# Patient Record
Sex: Male | Born: 1985 | Race: White | Hispanic: No | Marital: Single | Smoking: Current every day smoker
Health system: Southern US, Community
[De-identification: ages and names within clinical notes are randomized; demographics above are authoritative.]

## PROBLEM LIST (undated history)

## (undated) HISTORY — PX: OTHER SURGICAL HISTORY: SHX169

---
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
## 2014-03-14 ENCOUNTER — Encounter (HOSPITAL_BASED_OUTPATIENT_CLINIC_OR_DEPARTMENT_OTHER): Payer: Self-pay | Admitting: Emergency Medicine

## 2014-03-14 ENCOUNTER — Emergency Department (HOSPITAL_BASED_OUTPATIENT_CLINIC_OR_DEPARTMENT_OTHER)
Admission: EM | Admit: 2014-03-14 | Discharge: 2014-03-14 | Disposition: A | Discharge status: Home / Self Care | Payer: Self-pay | Attending: Emergency Medicine | Admitting: Emergency Medicine

## 2014-03-14 DIAGNOSIS — L0201 Cutaneous abscess of face: Secondary | ICD-10-CM | POA: Insufficient documentation

## 2014-03-14 DIAGNOSIS — Z720 Tobacco use: Secondary | ICD-10-CM | POA: Insufficient documentation

## 2014-03-14 DIAGNOSIS — L03211 Cellulitis of face: Secondary | ICD-10-CM

## 2014-03-14 DIAGNOSIS — L709 Acne, unspecified: Secondary | ICD-10-CM | POA: Insufficient documentation

## 2014-03-14 MED ORDER — CLINDAMYCIN HCL 150 MG PO CAPS
450.0000 mg | ORAL_CAPSULE | Freq: Once | ORAL | Status: AC
  Administered 2014-03-14: 450 mg via ORAL
  Filled 2014-03-14: qty 3

## 2014-03-14 MED ORDER — CLINDAMYCIN HCL 150 MG PO CAPS: 450.0000 mg | ORAL_CAPSULE | Freq: Three times a day (TID) | ORAL | Status: DC

## 2014-03-14 NOTE — ED Notes (Signed)
Patient has a noted infection with redness and swelling to his right eye x 3 days. Patient reports that he is now getting another spot under his left eye. Both peri orbital areas are grossly swollen

## 2014-03-14 NOTE — Discharge Instructions (Signed)
Abscess °An abscess (boil or furuncle) is an infected area on or under the skin. This area is filled with yellowish-white fluid (pus) and other material (debris). °HOME CARE  °· Only take medicines as told by your doctor. °· If you were given antibiotic medicine, take it as directed. Finish the medicine even if you start to feel better. °· If gauze is used, follow your doctor's directions for changing the gauze. °· To avoid spreading the infection: °¨ Keep your abscess covered with a bandage. °¨ Wash your hands well. °¨ Do not share personal care items, towels, or whirlpools with others. °¨ Avoid skin contact with others. °· Keep your skin and clothes clean around the abscess. °· Keep all doctor visits as told. °GET HELP RIGHT AWAY IF:  °· You have more pain, puffiness (swelling), or redness in the wound site. °· You have more fluid or blood coming from the wound site. °· You have muscle aches, chills, or you feel sick. °· You have a fever. °MAKE SURE YOU:  °· Understand these instructions. °· Will watch your condition. °· Will get help right away if you are not doing well or get worse. °Document Released: 06/23/2007 Document Revised: 07/06/2011 Document Reviewed: 03/19/2011 °ExitCare® Patient Information ©2015 Exit[employer], LLC. This information is not intended to replace advice given to you by your health care provider. Make sure you discuss any questions you have with your health care provider. ° °

## 2014-03-14 NOTE — ED Provider Notes (Addendum)
CSN: [id number]     Arrival date & time 03/14/14  [id number] History   First MD Initiated Contact with Patient 03/14/14 0440     Chief Complaint  Patient presents with  . Cellulitis     (Consider location/radiation/quality/duration/timing/severity/associated sxs/prior Treatment) HPI  This is a 29 year old male with a history of multiple facial abscesses. He usually incises these himself. He developed a tender, cystic lesion lateral to his right eye about 3 days ago. 2 days ago he cut this open and expressed a large hard core of purulent material. He continues to have pain at the site and is requesting an antibiotic. He has been applying Neosporin without relief. He has been treated with clindamycin successfully in the past. He has multiple other small abscesses of his face and a few scattered lesions of his chest and extremities.  History reviewed. No pertinent past medical history. Past Surgical History  Procedure Laterality Date  . Vein replacent     No family history on file. History  Substance Use Topics  . Smoking status: Current Every Day Smoker  . Smokeless tobacco: Not on file  . Alcohol Use: No    Review of Systems  All other systems reviewed and are negative.   Allergies  Review of patient's allergies indicates no known allergies.  Home Medications   Prior to Admission medications   Not on File   BP 157/88 mmHg  Pulse 88  Temp(Src) 98.6 F (37 C) (Oral)  Resp 16  Ht 6' (1.829 m)  Wt 170 lb (77.111 kg)  BMI 23.05 kg/m2  SpO2 99%   Physical Exam  General: Well-developed, well-nourished male in no acute distress; appearance consistent with age of record HENT: normocephalic; healing wound lateral to right eye with surrounding tenderness and mild erythema Eyes: pupils equal, round and reactive to light; extraocular muscles intact Neck: supple Heart: regular rate and rhythm Lungs: clear to auscultation bilaterally Abdomen: soft; nondistended Extremities: No  deformity; full range of motion Neurologic: Awake, alert and oriented; motor function intact in all extremities and symmetric; no facial droop Skin: Warm and dry; facial acne with several small abscesses of the face, chest and extremities, none fluctuant or pointing Psychiatric: Normal mood and affect    ED Course  Procedures (including critical care time)   MDM    Hanley SeamenJohn L Seynabou Fults, MD 03/14/14 0449  Hanley SeamenJohn L Rorik Vespa, MD 03/14/14 [phone]

## 2014-04-22 ENCOUNTER — Encounter (HOSPITAL_BASED_OUTPATIENT_CLINIC_OR_DEPARTMENT_OTHER): Payer: Self-pay | Admitting: *Deleted

## 2014-04-22 ENCOUNTER — Emergency Department (HOSPITAL_BASED_OUTPATIENT_CLINIC_OR_DEPARTMENT_OTHER)
Admission: EM | Admit: 2014-04-22 | Discharge: 2014-04-22 | Disposition: A | Discharge status: Home / Self Care | Payer: Self-pay | Attending: Emergency Medicine | Admitting: Emergency Medicine

## 2014-04-22 ENCOUNTER — Emergency Department (HOSPITAL_BASED_OUTPATIENT_CLINIC_OR_DEPARTMENT_OTHER): Payer: Self-pay

## 2014-04-22 DIAGNOSIS — Z792 Long term (current) use of antibiotics: Secondary | ICD-10-CM | POA: Insufficient documentation

## 2014-04-22 DIAGNOSIS — Z791 Long term (current) use of non-steroidal anti-inflammatories (NSAID): Secondary | ICD-10-CM | POA: Insufficient documentation

## 2014-04-22 DIAGNOSIS — N5089 Other specified disorders of the male genital organs: Secondary | ICD-10-CM

## 2014-04-22 DIAGNOSIS — N451 Epididymitis: Secondary | ICD-10-CM | POA: Insufficient documentation

## 2014-04-22 DIAGNOSIS — Z720 Tobacco use: Secondary | ICD-10-CM | POA: Insufficient documentation

## 2014-04-22 LAB — URINALYSIS, ROUTINE W REFLEX MICROSCOPIC
BILIRUBIN URINE: NEGATIVE
GLUCOSE, UA: NEGATIVE mg/dL
Hgb urine dipstick: NEGATIVE
KETONES UR: NEGATIVE mg/dL
Nitrite: NEGATIVE
PH: 6 (ref 5.0–8.0)
PROTEIN: NEGATIVE mg/dL
Specific Gravity, Urine: 1.027 (ref 1.005–1.030)
Urobilinogen, UA: 1 mg/dL (ref 0.0–1.0)

## 2014-04-22 LAB — URINE MICROSCOPIC-ADD ON

## 2014-04-22 MED ORDER — AZITHROMYCIN 250 MG PO TABS
1000.0000 mg | ORAL_TABLET | Freq: Once | ORAL | Status: AC
  Administered 2014-04-22: 1000 mg via ORAL
  Filled 2014-04-22: qty 4

## 2014-04-22 MED ORDER — LIDOCAINE HCL (PF) 1 % IJ SOLN
INTRAMUSCULAR | Status: AC
  Administered 2014-04-22: 0.9 mL
  Filled 2014-04-22: qty 5

## 2014-04-22 MED ORDER — NAPROXEN 500 MG PO TABS: 500.0000 mg | ORAL_TABLET | Freq: Two times a day (BID) | ORAL | Status: DC

## 2014-04-22 MED ORDER — DOXYCYCLINE HYCLATE 100 MG PO CAPS: 100.0000 mg | ORAL_CAPSULE | Freq: Two times a day (BID) | ORAL | Status: DC

## 2014-04-22 MED ORDER — OXYCODONE-ACETAMINOPHEN 5-325 MG PO TABS
2.0000 | ORAL_TABLET | Freq: Once | ORAL | Status: AC
Start: 2014-04-22 — End: 2014-04-22
  Administered 2014-04-22: 2 via ORAL
  Filled 2014-04-22: qty 2

## 2014-04-22 MED ORDER — CEFTRIAXONE SODIUM 250 MG IJ SOLR
250.0000 mg | Freq: Once | INTRAMUSCULAR | Status: AC
  Administered 2014-04-22: 250 mg via INTRAMUSCULAR
  Filled 2014-04-22: qty 250

## 2014-04-22 MED ORDER — HYDROCODONE-ACETAMINOPHEN 5-325 MG PO TABS: 2.0000 | ORAL_TABLET | ORAL | Status: DC | PRN

## 2014-04-22 NOTE — Discharge Instructions (Signed)
Epididymitis Epididymitis is a swelling (inflammation) of the epididymis. The epididymis is a cord-like structure along the back part of the testicle. Epididymitis is usually, but not always, caused by infection. This is usually a sudden problem beginning with chills, fever and pain behind the scrotum and in the testicle. There may be swelling and redness of the testicle. DIAGNOSIS  Physical examination will reveal a tender, swollen epididymis. Sometimes, cultures are obtained from the urine or from prostate secretions to help find out if there is an infection or if the cause is a different problem. Sometimes, blood work is performed to see if your white blood cell count is elevated and if a germ (bacterial) or viral infection is present. Using this knowledge, an appropriate medicine which kills germs (antibiotic) can be chosen by your caregiver. A viral infection causing epididymitis will most often go away (resolve) without treatment. HOME CARE INSTRUCTIONS   Hot sitz baths for 20 minutes, 4 times per day, may help relieve pain.  Only take over-the-counter or prescription medicines for pain, discomfort or fever as directed by your caregiver.  Take all medicines, including antibiotics, as directed. Take the antibiotics for the full prescribed length of time even if you are feeling better.  It is very important to keep all follow-up appointments. SEEK IMMEDIATE MEDICAL CARE IF:   You have a fever.  You have pain not relieved with medicines.  You have any worsening of your problems.  Your pain seems to come and go.  You develop pain, redness, and swelling in the scrotum and surrounding areas. MAKE SURE YOU:   Understand these instructions.  Will watch your condition.  Will get help right away if you are not doing well or get worse. Document Released: 01/02/2000 Document Revised: 03/29/2011 Document Reviewed: 11/21/2008 [facility] Patient Information 2015 [place], [place]. This information  is not intended to replace advice given to you by your health care provider. Make sure you discuss any questions you have with your health care provider.  Pelvic Infection  If you have been diagnosed with a pelvic infection such as a sexually transmitted disease, you will need to be treated with antibiotics. Please take the medicines as prescribed. Some of these tests do not come back for 1-2 days in which case if they turn positive you will receive a phone call to let you know. If you are contacted and do have an infection consistent with a sexually transmitted disease, then you will need to tell any and all sexual partners that you have had in the last 6 months no so that they can be tested and treated as well. If you should develop severe or worsening pain in your abdomen or the pelvis or develop severe fevers,nausea or vomiting that prevent you from taking your medications, return to the emergency department immediately. Otherwise contact your local physician or county health department for a follow up appointment to complete STD testing including HIV and syphilis.  See the list of phone numbers below.  RESOURCE GUIDE  If unable to pay or uninsured, contact:  [facility] or [facility] to become qualified for the adult dental clinic.  Chronic Pain Problems Contact [facility]  [phone] Patients need to be referred by their primary care doctor.  Insufficient Money for Medicine Contact [employer]:  call "211" or [facility] [phone].  No Primary Care Doctor Call [facility]  [phone] Other agencies that provide inexpensive medical care    [facility]  [phone]    [facility]  [phone]    [facility]  [phone]    [facility]  [phone]    [facility]  [phone]    [facility]  [phone]

## 2014-04-22 NOTE — ED Notes (Signed)
Patient called for triage, patient outside walking around.  Patient returns to lobby needing wheelchair, wheelchair provided.

## 2014-04-22 NOTE — ED Notes (Addendum)
Directed to pharmacy to pick up meds- pt has called for his ride- no s/s of allergic reaction after rocephin given IM

## 2014-04-22 NOTE — ED Notes (Signed)
Patient transported to Ultrasound and returned 

## 2014-04-22 NOTE — ED Provider Notes (Signed)
CSN: [id number]     Arrival date & time 04/22/14  0909 History   First MD Initiated Contact with Patient 04/22/14 0920     Chief Complaint  Patient presents with  . Testicle Pain     (Consider location/radiation/quality/duration/timing/severity/associated sxs/prior Treatment) HPI Comments: 29 year old male, complains of gradual onset of swelling of his left testicle which has been persistent, gradually worsening, worse with palpation and ambulation. He denies associated fevers chills nausea or vomiting. He states this started approximately the same time that he had to push his Zena[employer] Niecevan when it ran out of gas. He denies any back pain. No medications given prior to arrival.  Patient is a 29 y.o. male presenting with testicular pain. The history is provided by the patient.  Testicle Pain    History reviewed. No pertinent past medical history. Past Surgical History  Procedure Laterality Date  . Vein replacent     No family history on file. History  Substance Use Topics  . Smoking status: Current Every Day Smoker  . Smokeless tobacco: Not on file  . Alcohol Use: No    Review of Systems  Constitutional: Negative for fever.  Gastrointestinal: Negative for nausea and vomiting.  Genitourinary: Positive for testicular pain.      Allergies  Review of patient's allergies indicates no known allergies.  Home Medications   Prior to Admission medications   Medication Sig Start Date End Date Taking? Authorizing Provider  clindamycin (CLEOCIN) 150 MG capsule Take 3 capsules (450 mg total) by mouth 3 (three) times daily. 03/14/14   John Molpus, MD  doxycycline (VIBRAMYCIN) 100 MG capsule Take 1 capsule (100 mg total) by mouth 2 (two) times daily. 04/22/14   Eber HongBrian Emmry Hinsch, MD  HYDROcodone-acetaminophen (NORCO/VICODIN) 5-325 MG per tablet Take 2 tablets by mouth every 4 (four) hours as needed. 04/22/14   Eber HongBrian Camira Geidel, MD  naproxen (NAPROSYN) 500 MG tablet Take 1 tablet (500 mg total) by mouth 2 (two)  times daily with a meal. 04/22/14   Eber HongBrian Hisham Provence, MD   BP 128/72 mmHg  Pulse 92  Temp(Src) 98.2 F (36.8 C) (Oral)  Resp 16  Ht 6' (1.829 m)  Wt 170 lb (77.111 kg)  BMI 23.05 kg/m2  SpO2 97% Physical Exam  Constitutional: He appears well-developed and well-nourished.  HENT:  Head: Normocephalic and atraumatic.  Eyes: Conjunctivae are normal. Right eye exhibits no discharge. Left eye exhibits no discharge.  Pulmonary/Chest: Effort normal. No respiratory distress.  Abdominal:  No masses or tenderness, no inguinal lymphadenopathy or masses  Genitourinary:  Normal appearing circumcised penis, left hemiscrotum is red, swollen, left testicle is enlarged and tender, right testicle normal, right hemiscrotum normal, no urethral discharge  Neurological: He is alert. Coordination normal.  Skin: Skin is warm and dry. No rash noted. He is not diaphoretic. No erythema.  Psychiatric: He has a normal mood and affect.  Nursing note and vitals reviewed.   ED Course  Procedures (including critical care time) Labs Review Labs Reviewed  URINALYSIS, ROUTINE W REFLEX MICROSCOPIC - Abnormal; Notable for the following:    Leukocytes, UA SMALL (*)    All other components within normal limits  URINE MICROSCOPIC-ADD ON - Abnormal; Notable for the following:    Squamous Epithelial / LPF FEW (*)    Bacteria, UA MANY (*)    All other components within normal limits    Imaging Review [place] Scrotum  04/22/2014   CLINICAL DATA:  Three days of left testicular de scrotal pain.  EXAM: SCROTAL ULTRASOUND  DOPPLER ULTRASOUND OF THE TESTICLES  TECHNIQUE: Complete ultrasound examination of the testicles, epididymis, and other scrotal structures was performed. Color and spectral Doppler ultrasound were also utilized to evaluate blood flow to the testicles.  COMPARISON:  None.  FINDINGS: Right testicle  Measurements: 4.3 x 2.1 x 2.5 cm. No mass or microlithiasis visualized.  Left testicle  Measurements: 3.7 x 2.1 x 2.8 cm.  No mass or microlithiasis visualized.  Right epididymis:  Normal in size and appearance.  Left epididymis: The epididymis on the left is enlarged and exhibits heterogeneously decreased echotexture.  Hydrocele:  There is a hydrocele on left containing septations.  Varicocele:  None visualized.  Pulsed Doppler interrogation of both testes demonstrates normal low resistance arterial and venous waveforms bilaterally.  IMPRESSION: 1. The testes and testicular vascularity is normal. The right epididymis is normal. 2. Increased size and heterogeneous echotexture of the left epididymis most compatible with acute epididymitis. There is also a complex appearing hydrocele on the left likely reflecting inflammatory change.   Electronically Signed   By: David  [place]   On: 04/22/2014 10:40   [place] Art/ven Flow Abd Pelv Doppler  04/22/2014   CLINICAL DATA:  Three days of left testicular de scrotal pain.  EXAM: SCROTAL ULTRASOUND  DOPPLER ULTRASOUND OF THE TESTICLES  TECHNIQUE: Complete ultrasound examination of the testicles, epididymis, and other scrotal structures was performed. Color and spectral Doppler ultrasound were also utilized to evaluate blood flow to the testicles.  COMPARISON:  None.  FINDINGS: Right testicle  Measurements: 4.3 x 2.1 x 2.5 cm. No mass or microlithiasis visualized.  Left testicle  Measurements: 3.7 x 2.1 x 2.8 cm. No mass or microlithiasis visualized.  Right epididymis:  Normal in size and appearance.  Left epididymis: The epididymis on the left is enlarged and exhibits heterogeneously decreased echotexture.  Hydrocele:  There is a hydrocele on left containing septations.  Varicocele:  None visualized.  Pulsed Doppler interrogation of both testes demonstrates normal low resistance arterial and venous waveforms bilaterally.  IMPRESSION: 1. The testes and testicular vascularity is normal. The right epididymis is normal. 2. Increased size and heterogeneous echotexture of the left epididymis most  compatible with acute epididymitis. There is also a complex appearing hydrocele on the left likely reflecting inflammatory change.   Electronically Signed   By: David  [place]   On: 04/22/2014 10:40     MDM   Final diagnoses:  Testicle swelling  Epididymitis   No visible cremasteric reflex. Concern for testicular torsion or epididymitis the latter being more likely as the patient has a history of gonorrhea per his report.  Vs normal.  [place] c/w epidydimitis - VS normal - pt given meds as below - stable for d/c.  Meds given in ED:  Medications  cefTRIAXone (ROCEPHIN) injection 250 mg (not administered)  azithromycin (ZITHROMAX) tablet 1,000 mg (not administered)  oxyCODONE-acetaminophen (PERCOCET/ROXICET) 5-325 MG per tablet 2 tablet (2 tablets Oral Given 04/22/14 0948)    New Prescriptions   DOXYCYCLINE (VIBRAMYCIN) 100 MG CAPSULE    Take 1 capsule (100 mg total) by mouth 2 (two) times daily.   HYDROCODONE-ACETAMINOPHEN (NORCO/VICODIN) 5-325 MG PER TABLET    Take 2 tablets by mouth every 4 (four) hours as needed.   NAPROXEN (NAPROSYN) 500 MG TABLET    Take 1 tablet (500 mg total) by mouth 2 (two) times daily with a meal.        Eber Hong, MD 04/22/14 1052

## 2014-04-22 NOTE — ED Notes (Signed)
Pt c/o swollen left testicle x2 days. Pt denies dysuria.

## 2018-08-14 ENCOUNTER — Emergency Department (HOSPITAL_COMMUNITY)
Admission: EM | Admit: 2018-08-14 | Discharge: 2018-08-14 | Disposition: A | Discharge status: Home / Self Care | Payer: Self-pay | Attending: Emergency Medicine | Admitting: Emergency Medicine

## 2018-08-14 ENCOUNTER — Encounter (HOSPITAL_COMMUNITY): Payer: Self-pay

## 2018-08-14 ENCOUNTER — Emergency Department (HOSPITAL_COMMUNITY): Payer: Self-pay

## 2018-08-14 ENCOUNTER — Other Ambulatory Visit: Payer: Self-pay

## 2018-08-14 DIAGNOSIS — F1210 Cannabis abuse, uncomplicated: Secondary | ICD-10-CM | POA: Insufficient documentation

## 2018-08-14 DIAGNOSIS — F1410 Cocaine abuse, uncomplicated: Secondary | ICD-10-CM | POA: Insufficient documentation

## 2018-08-14 DIAGNOSIS — N50811 Right testicular pain: Secondary | ICD-10-CM

## 2018-08-14 DIAGNOSIS — R1030 Lower abdominal pain, unspecified: Secondary | ICD-10-CM

## 2018-08-14 DIAGNOSIS — M545 Low back pain, unspecified: Secondary | ICD-10-CM

## 2018-08-14 DIAGNOSIS — N453 Epididymo-orchitis: Secondary | ICD-10-CM

## 2018-08-14 DIAGNOSIS — R319 Hematuria, unspecified: Secondary | ICD-10-CM | POA: Insufficient documentation

## 2018-08-14 DIAGNOSIS — F1510 Other stimulant abuse, uncomplicated: Secondary | ICD-10-CM | POA: Insufficient documentation

## 2018-08-14 DIAGNOSIS — R300 Dysuria: Secondary | ICD-10-CM

## 2018-08-14 DIAGNOSIS — F17210 Nicotine dependence, cigarettes, uncomplicated: Secondary | ICD-10-CM | POA: Insufficient documentation

## 2018-08-14 LAB — URINALYSIS, ROUTINE W REFLEX MICROSCOPIC
Bilirubin Urine: NEGATIVE
Glucose, UA: NEGATIVE mg/dL
Hgb urine dipstick: NEGATIVE
Ketones, ur: NEGATIVE mg/dL
Leukocytes,Ua: NEGATIVE
Nitrite: NEGATIVE
Protein, ur: NEGATIVE mg/dL
Specific Gravity, Urine: 1.01 (ref 1.005–1.030)
pH: 8 (ref 5.0–8.0)

## 2018-08-14 LAB — CBC WITH DIFFERENTIAL/PLATELET
Abs Immature Granulocytes: 0.06 10*3/uL (ref 0.00–0.07)
Basophils Absolute: 0 10*3/uL (ref 0.0–0.1)
Basophils Relative: 0 %
Eosinophils Absolute: 0.1 10*3/uL (ref 0.0–0.5)
Eosinophils Relative: 1 %
HCT: 44.1 % (ref 39.0–52.0)
Hemoglobin: 14.6 g/dL (ref 13.0–17.0)
Immature Granulocytes: 1 %
Lymphocytes Relative: 10 %
Lymphs Abs: 1.2 10*3/uL (ref 0.7–4.0)
MCH: 30.1 pg (ref 26.0–34.0)
MCHC: 33.1 g/dL (ref 30.0–36.0)
MCV: 90.9 fL (ref 80.0–100.0)
Monocytes Absolute: 0.9 10*3/uL (ref 0.1–1.0)
Monocytes Relative: 8 %
Neutro Abs: 10.3 10*3/uL — ABNORMAL HIGH (ref 1.7–7.7)
Neutrophils Relative %: 80 %
Platelets: 242 10*3/uL (ref 150–400)
RBC: 4.85 MIL/uL (ref 4.22–5.81)
RDW: 13.1 % (ref 11.5–15.5)
WBC: 12.6 10*3/uL — ABNORMAL HIGH (ref 4.0–10.5)
nRBC: 0 % (ref 0.0–0.2)

## 2018-08-14 LAB — COMPREHENSIVE METABOLIC PANEL
ALT: 23 U/L (ref 0–44)
AST: 21 U/L (ref 15–41)
Albumin: 3.7 g/dL (ref 3.5–5.0)
Alkaline Phosphatase: 61 U/L (ref 38–126)
Anion gap: 11 (ref 5–15)
BUN: 12 mg/dL (ref 6–20)
CO2: 27 mmol/L (ref 22–32)
Calcium: 9.2 mg/dL (ref 8.9–10.3)
Chloride: 97 mmol/L — ABNORMAL LOW (ref 98–111)
Creatinine, Ser: 0.88 mg/dL (ref 0.61–1.24)
GFR calc Af Amer: >60 mL/min (ref >60)
GFR calc non Af Amer: >60 mL/min (ref >60)
Glucose, Bld: 117 mg/dL — ABNORMAL HIGH (ref 70–99)
Potassium: 4.1 mmol/L (ref 3.5–5.1)
Sodium: 135 mmol/L (ref 135–145)
Total Bilirubin: 0.6 mg/dL (ref 0.3–1.2)
Total Protein: 7.9 g/dL (ref 6.5–8.1)

## 2018-08-14 LAB — RAPID URINE DRUG SCREEN, HOSP PERFORMED
Amphetamines: POSITIVE — AB
Barbiturates: NOT DETECTED
Benzodiazepines: NOT DETECTED
Cocaine: NOT DETECTED
Opiates: NOT DETECTED
Tetrahydrocannabinol: NOT DETECTED

## 2018-08-14 LAB — LIPASE, BLOOD: Lipase: 23 U/L (ref 11–51)

## 2018-08-14 MED ORDER — CEFTRIAXONE SODIUM 250 MG IJ SOLR
250.0000 mg | Freq: Once | INTRAMUSCULAR | Status: AC
  Administered 2018-08-14: 12:00:00 250 mg via INTRAMUSCULAR
  Filled 2018-08-14: qty 250

## 2018-08-14 MED ORDER — IOHEXOL 300 MG/ML  SOLN
100.0000 mL | Freq: Once | INTRAMUSCULAR | Status: AC | PRN
  Administered 2018-08-14: 12:00:00 100 mL via INTRAVENOUS

## 2018-08-14 MED ORDER — SODIUM CHLORIDE (PF) 0.9 % IJ SOLN
INTRAMUSCULAR | Status: AC
  Filled 2018-08-14: qty 50

## 2018-08-14 MED ORDER — LIDOCAINE HCL 1 % IJ SOLN
INTRAMUSCULAR | Status: AC
  Administered 2018-08-14: 12:00:00
  Filled 2018-08-14: qty 20

## 2018-08-14 MED ORDER — ONDANSETRON 4 MG PO TBDP
4.0000 mg | ORAL_TABLET | Freq: Once | ORAL | Status: AC
  Administered 2018-08-14: 12:00:00 4 mg via ORAL
  Filled 2018-08-14: qty 1

## 2018-08-14 MED ORDER — AZITHROMYCIN 250 MG PO TABS
1000.0000 mg | ORAL_TABLET | Freq: Once | ORAL | Status: AC
  Administered 2018-08-14: 12:00:00 1000 mg via ORAL
  Filled 2018-08-14: qty 4

## 2018-08-14 MED ORDER — KETOROLAC TROMETHAMINE 30 MG/ML IJ SOLN
30.0000 mg | Freq: Once | INTRAMUSCULAR | Status: AC
  Administered 2018-08-14: 12:00:00 30 mg via INTRAVENOUS
  Filled 2018-08-14: qty 1

## 2018-08-14 MED ORDER — NAPROXEN 500 MG PO TABS: 500.0000 mg | ORAL_TABLET | Freq: Two times a day (BID) | ORAL | 0 refills | Status: AC

## 2018-08-14 NOTE — ED Notes (Signed)
Discharge instructions reviewed with patient. Patient verbalizes understanding. VSS. Patient discharged to [employer] custody. PIV removed. Discharge instructions and prescription given to [facility] officer.

## 2018-08-14 NOTE — ED Notes (Signed)
GC chlamydia swab completed by EDPA. This RN acted as witness. Patient tolerated well.

## 2018-08-14 NOTE — Discharge Instructions (Addendum)
You have been seen today for testicular pain/edema, lower abdominal pain, and back pain. Please read and follow all provided instructions.   1. Medications: naproxen for pain, usual home medications 2. Treatment: rest, drink plenty of fluids 3. Follow Up: Please follow up with your primary doctor in 2 days for discussion of your diagnoses and further evaluation after today's visit; if you do not have a primary care doctor use the resource guide provided to find one; Please return to the ER for any new or worsening symptoms. Please obtain all of your results from medical records or have your doctors office obtain the results - share them with your doctor - you should be seen at your doctors office. Call today to arrange your follow up.   Take medications as prescribed. Please review all of the medicines and only take them if you do not have an allergy to them. Return to the emergency room for worsening condition or new concerning symptoms. Follow up with your regular doctor. If you don't have a regular doctor use one of the numbers below to establish a primary care doctor.  Please be aware that if you are taking birth control pills, taking other prescriptions, ESPECIALLY ANTIBIOTICS may make the birth control ineffective - if this is the case, either do not engage in sexual activity or use alternative methods of birth control such as condoms until you have finished the medicine and your family doctor says it is OK to restart them. If you are on a blood thinner such as COUMADIN, be aware that any other medicine that you take may cause the coumadin to either work too much, or not enough - you should have your coumadin level rechecked in next 7 days if this is the case.  ?  It is also a possibility that you have an allergic reaction to any of the medicines that you have been prescribed - Everybody reacts differently to medications and while MOST people have no trouble with most medicines, you may have a  reaction such as nausea, vomiting, rash, swelling, shortness of breath. If this is the case, please stop taking the medicine immediately and contact your physician.  ?  You should return to the ER if you develop severe or worsening symptoms.   Emergency Department Resource Guide 1) Find a Doctor and Pay Out of Pocket Although you won't have to find out who is covered by your insurance plan, it is a good idea to ask around and get recommendations. You will then need to call the office and see if the doctor you have chosen will accept you as a new patient and what types of options they offer for patients who are self-pay. Some doctors offer discounts or will set up payment plans for their patients who do not have insurance, but you will need to ask so you aren't surprised when you get to your appointment.  2) Contact Your Local Health Department Not all health departments have doctors that can see patients for sick visits, but many do, so it is worth a call to see if yours does. If you don't know where your local health department is, you can check in your phone book. The CDC also has a tool to help you locate your state's health department, and many state websites also have listings of all of their local health departments.  3) Find a [facility] If your illness is not likely to be very severe or complicated, you may want to try a  walk in clinic. These are popping up all over the country in pharmacies, drugstores, and shopping centers. They're usually staffed by nurse practitioners or physician assistants that have been trained to treat common illnesses and complaints. They're usually fairly quick and inexpensive. However, if you have serious medical issues or chronic medical problems, these are probably not your best option.  No Primary Care Doctor: Call [facility] at  [phone] - they can help you locate a primary care doctor that  accepts your insurance, provides certain services,  etc. Physician Referral Service- [phone]  Chronic Pain Problems: Organization         Address  Phone   Notes  [facility]  [phone] Patients need to be referred by their primary care doctor.   Medication Assistance: Organization         Address  Phone   Notes  [facility] Medication [facility] [address] [place], [place] [address] [phone] --Must be a resident of [facility] -- Must have NO insurance coverage whatsoever (no Medicaid/ Medicare, etc.) -- The pt. MUST have a primary care doctor that directs their care regularly and follows them in the community   [facility]  [phone]   [employer]  [phone]    Agencies that provide inexpensive medical care: Organization         Address  Phone   Notes  [place]  [phone]   [facility]    [phone]   [facility] [place], [place] [address] [phone]   [place] [address] [place] [phone]   [facility]    [phone]   [facility]    [phone]   [place] and [place] [address], [place] Phone:  [phone], Fax:  [phone] Hours of Operation:  9 am - 6 pm, M-F.  Also accepts Medicaid/Medicare and self-pay.  [facility] for [place] [address], [address], [place] Phone: [phone], Fax: [phone]. Hours of Operation:  8:30 am - 5:30 pm, M-F.  Also accepts Medicaid and self-pay.  [facility] [address], [place] Phone: [phone]   [place], [place], [place] [phone], Ext. [phone] Mondays & Thursdays: 7-9 AM.  First 15 patients are seen on a first come, first serve basis.    [place] Providers:  Organization         Address  Phone   Notes  [facility] [address], [place] [phone] Also accepts self-pay patients.  [facility] [address], [place]  [phone]   [place], [address], [place] [phone]   [facility] [address] [place] [phone]   Lucianne Lei [address], [place]   [phone] Only accepts [place] [facility] [place] patients after they have their name applied to their card.   Self-Pay (no insurance) in [facility]:  Organization         Address  Phone   Notes  Sickle Cell Patients, [facility] [place] [phone]   [facility] [place] [phone]   [facility] [place]  [address], [address], [place] [phone]   [facility]/Dr. Osei-Bonsu  [address] [place] or [address], [place] [phone] Phone number for both [place] and [place] locations is the same.  [facility] and [facility] [address] [place] [phone]   [facility] [address], [place] or [address] [phone]   [facility] [address] [place] [phone], phone; [phone], fax Sees patients 1st and 3rd Saturday of every month.  Must not qualify for public or private insurance (i.e. Medicaid, Medicare, [place] [employer] Choice, Veterans' Benefits)  Household income should be no more than 200% of the poverty level The clinic cannot treat you if you are pregnant or think you are pregnant  Sexually transmitted diseases are not treated at the clinic.

## 2018-08-14 NOTE — ED Provider Notes (Signed)
[facility]-EMERGENCY DEPT Provider Note   CSN: [id number] Arrival date & time: 08/14/18  0845    History   Chief Complaint Chief Complaint  Patient presents with   Back Pain   Abdominal Pain   Groin Swelling   Dysuria    HPI Samuel Potter is a 33 y.o. male with a PMH of Epididymitis presents with constant right sided testicular edema, pain, and erythema onset 2 days ago. Patient also has associated dysuria, penile discharge, abdominal pain, bilateral low back pain, and hematuria. Patient denies fever, vomiting, diarrhea or constipation. Patient denies any difficulty with bowel movements. Patient states he is sexually active with one person and does not use protection. Patient denies taking any medications prior to arrival. Patient states symptoms are worse with palpation and nothing makes symptoms better. Patient reports he had similar symptoms 3 months ago and required antibiotics. Patient denies sick contacts or recent travel. Patient denies any abdominal surgeries. Denies numbness, tingling, weakness, incontinence to bowel/bladder, fever, chills, recent IV drug use, or hx of cancer. Patient was brought by [employer] due to multiple felony charges.      HPI  History reviewed. No pertinent past medical history.  There are no active problems to display for this patient.   Past Surgical History:  Procedure Laterality Date   vein replacement     vein replacent          Home Medications    Prior to Admission medications   Medication Sig Start Date End Date Taking? Authorizing Provider  naproxen (NAPROSYN) 500 MG tablet Take 1 tablet (500 mg total) by mouth 2 (two) times daily. 08/14/18   Leretha DykesHernandez, Asberry Lascola P, PA-C    Family History History reviewed. No pertinent family history.  Social History Social History   Tobacco Use   Smoking status: Current Every Day Smoker    Packs/day: 0.50    Types: Cigarettes   Smokeless tobacco: Never Used   Substance Use Topics   Alcohol use: No   Drug use: Yes    Types: Methamphetamines, Marijuana, Cocaine    Comment: heroin     Allergies   Patient has no known allergies.   Review of Systems Review of Systems  Constitutional: Negative for activity change, appetite change, chills, fever and unexpected weight change.  HENT: Negative for congestion, rhinorrhea and sore throat.   Eyes: Negative for visual disturbance.  Respiratory: Negative for cough and shortness of breath.   Cardiovascular: Negative for chest pain.  Gastrointestinal: Positive for abdominal pain. Negative for abdominal distention, anal bleeding, blood in stool, constipation, diarrhea, nausea, rectal pain and vomiting.  Endocrine: Negative for polydipsia, polyphagia and polyuria.  Genitourinary: Positive for discharge, dysuria, hematuria, scrotal swelling and testicular pain. Negative for flank pain, frequency, genital sores, penile pain and penile swelling.  Musculoskeletal: Positive for back pain. Negative for gait problem.  Skin: Positive for color change. Negative for rash.  Allergic/Immunologic: Negative for immunocompromised state.  Psychiatric/Behavioral: The patient is not nervous/anxious.     Physical Exam Updated Vital Signs BP 116/72    Pulse 93    Temp 98.3 F (36.8 C) (Oral)    Resp 18    Ht 6' (1.829 m)    Wt 81.6 kg    SpO2 98%    BMI 24.41 kg/m   Physical Exam Vitals signs and nursing note reviewed. Exam conducted with a chaperone present.  Constitutional:      General: He is not in acute distress.    Appearance:  He is well-developed. He is not diaphoretic.  HENT:     Head: Normocephalic and atraumatic.     Mouth/Throat:     Mouth: Mucous membranes are moist.     Pharynx: Oropharynx is clear. No pharyngeal swelling or oropharyngeal exudate.  Neck:     Musculoskeletal: Normal range of motion.  Cardiovascular:     Rate and Rhythm: Normal rate and regular rhythm.     Heart sounds: Normal  heart sounds. No murmur. No friction rub. No gallop.   Pulmonary:     Effort: Pulmonary effort is normal. No respiratory distress.     Breath sounds: Normal breath sounds. No wheezing or rales.  Abdominal:     General: Abdomen is flat.     Palpations: Abdomen is soft.     Tenderness: There is abdominal tenderness in the right lower quadrant, suprapubic area and left lower quadrant. There is right CVA tenderness, left CVA tenderness and guarding. There is no rebound.     Hernia: There is no hernia in the left inguinal area or right inguinal area.  Genitourinary:    Penis: Circumcised. No phimosis, paraphimosis, hypospadias, erythema, tenderness, discharge, swelling or lesions.      Scrotum/Testes:        Right: Tenderness and swelling present. Mass not present.        Left: Mass, tenderness or swelling not present.     Epididymis:     Right: Inflamed. Not enlarged. Tenderness present. No mass.     Left: Not inflamed or enlarged. No mass or tenderness.  Musculoskeletal: Normal range of motion.  Lymphadenopathy:     Lower Body: No right inguinal adenopathy. No left inguinal adenopathy.  Skin:    General: Skin is warm.     Findings: No erythema or rash.  Neurological:     Mental Status: He is alert.     ED Treatments / Results  Labs (all labs ordered are listed, but only abnormal results are displayed) Labs Reviewed  CBC WITH DIFFERENTIAL/PLATELET - Abnormal; Notable for the following components:      Result Value   WBC 12.6 (*)    Neutro Abs 10.3 (*)    All other components within normal limits  COMPREHENSIVE METABOLIC PANEL - Abnormal; Notable for the following components:   Chloride 97 (*)    Glucose, Bld 117 (*)    All other components within normal limits  RAPID URINE DRUG SCREEN, HOSP PERFORMED - Abnormal; Notable for the following components:   Amphetamines POSITIVE (*)    All other components within normal limits  URINE CULTURE  URINALYSIS, ROUTINE W REFLEX  MICROSCOPIC  LIPASE, BLOOD  GC/CHLAMYDIA PROBE AMP ([place]) NOT AT [facility]    EKG None  Radiology Ct Abdomen Pelvis W Contrast  Result Date: 08/14/2018 CLINICAL DATA:  Right lower quadrant pain extending into right scrotal region. Dysuria. EXAM: CT ABDOMEN AND PELVIS WITH CONTRAST TECHNIQUE: Multidetector CT imaging of the abdomen and pelvis was performed using the standard protocol following bolus administration of intravenous contrast. CONTRAST:  100mL OMNIPAQUE IOHEXOL 300 MG/ML  SOLN COMPARISON:  Scrotal ultrasound August 14, 2018 FINDINGS: Lower chest: There is bibasilar atelectasis. There is no lung base edema consolidation. Hepatobiliary: There is mild fatty infiltration near the fissure for the ligamentum teres. No focal liver lesions are evident. The gallbladder wall is appreciably thickened. No biliary duct dilatation. Pancreas: There is no pancreatic mass or inflammatory focus. Spleen: No splenic lesions are evident. Adrenals/Urinary Tract: Adrenals appear unremarkable. Kidneys bilaterally  show no evident mass or hydronephrosis on either side. There is an extrarenal pelvis on each side. There is no evident renal or ureteral calculus on either side. Urinary bladder is midline with wall thickness within normal limits. Stomach/Bowel: There is no appreciable bowel wall or mesenteric thickening. No evident bowel obstruction. Terminal ileum appears unremarkable. No free air or portal venous air evident. Vascular/Lymphatic: No abdominal aortic aneurysm. No vascular lesions are appreciable period no adenopathy is evident in the abdomen or pelvis by size criteria. Subcentimeter inguinal lymph nodes are considered nonspecific. Reproductive: Prostate and seminal vesicles are normal in size and contour. There is thickening along the upper scrotum on the right, consistent with the changes seen on scrotal ultrasound obtained earlier in the day. Other: The appendix appears normal. No abscess or ascites  evident in the abdomen or pelvis. Musculoskeletal: There are pars defects at L5 bilaterally with slight anterolisthesis of L5 on S1. There are no blastic or lytic bone lesions. There is no intramuscular lesion evident. IMPRESSION: 1. There is known inflammation within the right scrotal sac. The superior most aspect of this inflammation is seen on this CT examination. Note that most of the scrotum is not imaged on this CT examination. 2. No bowel obstruction. No abscess in the abdomen or pelvis. Appendix appears normal. 3. No evident renal or ureteral calculus. No hydronephrosis. Urinary bladder wall thickness normal. 4. 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 Electronically Signed   By: Bretta BangWilliam  Woodruff III M.D.   On: 08/14/2018 11:58   [place] Scrotum W/doppler  Result Date: 08/14/2018 CLINICAL DATA:  RIGHT testicular pain for 2 days.  Edema. EXAM: SCROTAL ULTRASOUND DOPPLER ULTRASOUND OF THE TESTICLES TECHNIQUE: Complete ultrasound examination of the testicles, epididymis, and other scrotal structures was performed. Color and spectral Doppler ultrasound were also utilized to evaluate blood flow to the testicles. COMPARISON:  04/22/2014 FINDINGS: Right testicle Measurements: 4.0 x 2.4 x 2.2 centimeters. No mass or microlithiasis visualized. There is asymmetric increased vascularity in the RIGHT testicle. Left testicle Measurements: 2.4 x 1.7 x 1.8 centimeters. No mass or microlithiasis visualized. Right epididymis: Enlarged RIGHT epididymis. Increased vascularity. No mass. Left epididymis: 0.2 x 0.2 x 0.2 cyst identified within the LEFT epididymal head. Hydrocele: Complex hydrocele on the RIGHT. Small simple hydrocele on the LEFT. Varicocele:  None visualized. Pulsed Doppler interrogation of both testes demonstrates normal low resistance arterial and venous waveforms bilaterally. Note is made of skin thickening in the scrotum. IMPRESSION: 1. Increased vascularity of the RIGHT testis  and epididymis, consistent with epididymo-orchitis. 2. Complex RIGHT hydrocele. 3. No evidence for testicular mass or torsion. Electronically Signed   By: Norva PavlovElizabeth  Brown M.D.   On: 08/14/2018 10:35    Procedures Procedures (including critical care time)  Medications Ordered in ED Medications  sodium chloride (PF) 0.9 % injection (has no administration in time range)  cefTRIAXone (ROCEPHIN) injection 250 mg (250 mg Intramuscular Given 08/14/18 1211)  azithromycin (ZITHROMAX) tablet 1,000 mg (1,000 mg Oral Given 08/14/18 1211)  ondansetron (ZOFRAN-ODT) disintegrating tablet 4 mg (4 mg Oral Given 08/14/18 1211)  ketorolac (TORADOL) 30 MG/ML injection 30 mg (30 mg Intravenous Given 08/14/18 1210)  iohexol (OMNIPAQUE) 300 MG/ML solution 100 mL (100 mLs Intravenous Contrast Given 08/14/18 1146)  lidocaine (XYLOCAINE) 1 % (with pres) injection (  Given 08/14/18 1212)     Initial Impression / Assessment and Plan / ED Course  I have reviewed the triage vital signs and the nursing notes.  Pertinent labs &  imaging results that were available during my care of the patient were reviewed by me and considered in my medical decision making (see chart for details).  Clinical Course as of Aug 13 1229  Mon Aug 14, 2018  1044 1. Increased vascularity of the RIGHT testis and epididymis, consistent with epididymo-orchitis. 2. Complex RIGHT hydrocele. 3. No evidence for testicular mass or torsion.    US SCROTUM W/DOPPLER [AH]  1044 Leukocytosis noted at 12.6.  WBC(!): 12.6 [AH]  1044 UDS positive for amphetamines.   Amphetamines(!): POSITIVE [AH]  1044 UA is unremarkable.   Urinalysis, Routine w reflex microscopic- may I&O cath if menses [AH]  1204 1. There is known inflammation within the right scrotal sac. The superior most aspect of this inflammation is seen on this CT examination. Note that most of the scrotum is not imaged on this CT examination.  2. No bowel obstruction. No abscess in the  abdomen or pelvis. Appendix appears normal.  3. No evident renal or ureteral calculus. No hydronephrosis. Urinary bladder wall thickness normal.  4. 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    CT Abdomen Pelvis W Contrast [AH]    Clinical Course User Index [AH] Arville Lime, PA-C      Patient presents with testicular pain, dysuria, lower abdominal pain, and acute bilateral low back pain. Labs, vitals, and imaging reviewed. Leukocytosis noted at 12.6. STD testing obtained and patient understands cultures are pending and he will need to notify partners if results are positive. UA is negative. Ultrasound of scrotum reveals increased vascularity of the right testis and epididymis, consistent with epididymo-orchitis. Complex right hydrocele noted, but no evidence of testicular mass or torsion. Obtained CT abdomen due to lower abdominal pain.  CT reveals no bowel obstruction, normal appendix, and no evident renal or ureteral calculus. Provided antibiotics for epididymo-orchitis, pain medicine, and antiemetics while in the ER. Prescribed naproxen for pain. Upon reassessment, patient is sleeping in the room in no acute distress. Discussed results and return precautions with patient. Patient states he understands and agrees with plan. Patient is stable for discharge at this time.  Final Clinical Impressions(s) / ED Diagnoses   Final diagnoses:  Acute bilateral low back pain without sciatica  Dysuria  Lower abdominal pain  Testicular pain, right  Epididymo-orchitis, acute    ED Discharge Orders         Ordered    naproxen (NAPROSYN) 500 MG tablet  2 times daily     08/14/18 1230           Darlin Drop [place], [place] 08/14/18 1232    Charlesetta Shanks, MD 08/19/18 1059

## 2018-08-14 NOTE — ED Triage Notes (Signed)
Patient c/o right scrotal pain and swelling, RLQ pain, and bilateral lower back pain x 2 days. Patient also c/o dysuria and penile drainage with blood.  Patient brought in by [facility] officers x 2. Patient is very drowsy.

## 2018-08-14 NOTE — ED Notes (Signed)
This RN attempted PIV placement and was unsuccessful. Ronalee Belts RN to place ultrasound IV.

## 2018-08-15 LAB — GC/CHLAMYDIA PROBE AMP (~~LOC~~) NOT AT ARMC
Chlamydia: NEGATIVE
Neisseria Gonorrhea: NEGATIVE

## 2018-08-16 LAB — URINE CULTURE: Culture: 40000 — AB

## 2018-08-17 ENCOUNTER — Telehealth: Payer: Self-pay

## 2018-08-17 NOTE — Telephone Encounter (Signed)
Post ED Visit - Positive Culture Follow-up: Successful Patient Follow-Up  Culture assessed and recommendations reviewed by:  []  Elenor Quinones, Pharm.D. []  Heide Guile, Pharm.D., BCPS AQ-ID []  Parks Neptune, Pharm.D., BCPS []  Alycia Rossetti, Pharm.D., BCPS []  [place], Pharm.D., BCPS, AAHIVP []  Legrand Como, Pharm.D., BCPS, AAHIVP []  Salome Arnt, PharmD, BCPS []  Johnnette Gourd, PharmD, BCPS []  Hughes Better, PharmD, BCPS []  Leeroy Cha, PharmD Dia Sitter Pharm D Positive urine culture  [x]  Patient discharged without antimicrobial prescription and treatment is now indicated []  Organism is resistant to prescribed ED discharge antimicrobial []  Patient with positive blood cultures  Changes discussed with ED provider: Domenic Moras [facility] New antibiotic prescription Keflex 500 mg q6hr x 7 days Called to [facility] [phone]  Contacted patient, date 08/17/2018, time 1137   Samuel Potter, Carolynn Comment 08/17/2018, 11:36 AM

## 2018-08-17 NOTE — Telephone Encounter (Deleted)
Called for [facility] check for [facility] ED 08/10/2018 per PharmD. NO symptoms Has f?u with urologist

## 2020-01-07 IMAGING — CT CT ABDOMEN AND PELVIS WITH CONTRAST
2 of 4 series · 16 of 46 positions shown, 18 images · IV contrast (OMNIPAQUE)
Comparison: Scrotal ultrasound August 14, 2018

CLINICAL DATA: Right lower quadrant pain extending into right
scrotal region. Dysuria.

EXAM:
CT ABDOMEN AND PELVIS WITH CONTRAST
TECHNIQUE: Multidetector CT imaging of the abdomen and pelvis was performed
using the standard protocol following bolus administration of
intravenous contrast.
CONTRAST:  100mL OMNIPAQUE IOHEXOL 300 MG/ML  SOLN

[Series 2: axial st · axial · 0.76mm/px · z∈[-680,-240]mm · 13 of 100 slices shown, 15 images]
[im 6/100  soft-tissue]
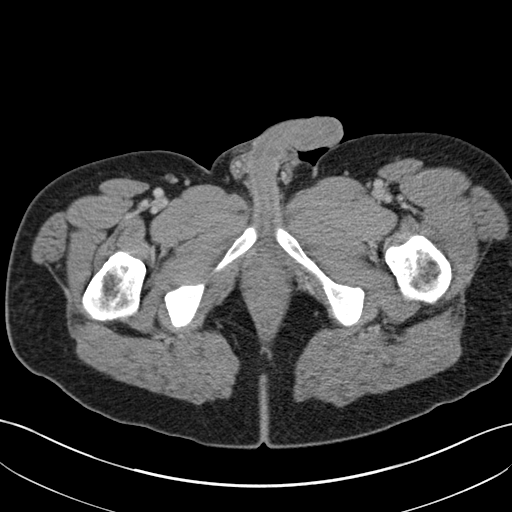
[im 6/100  bone]
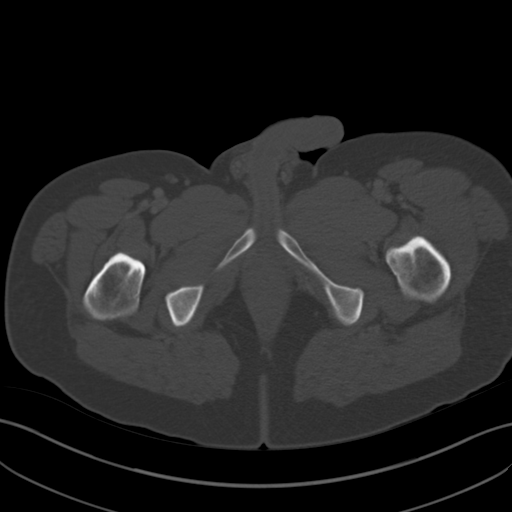
[im 12/100  soft-tissue]
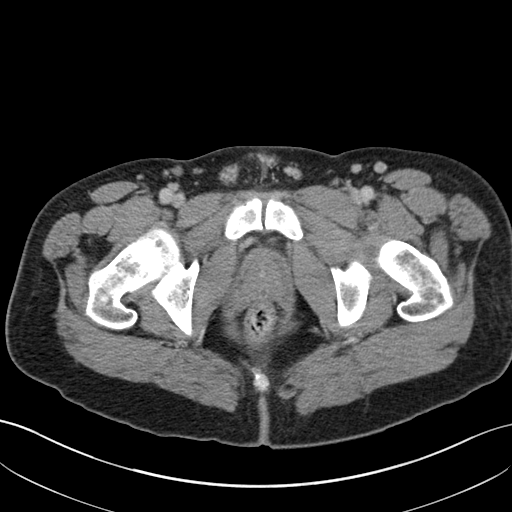
[im 23/100  soft-tissue]
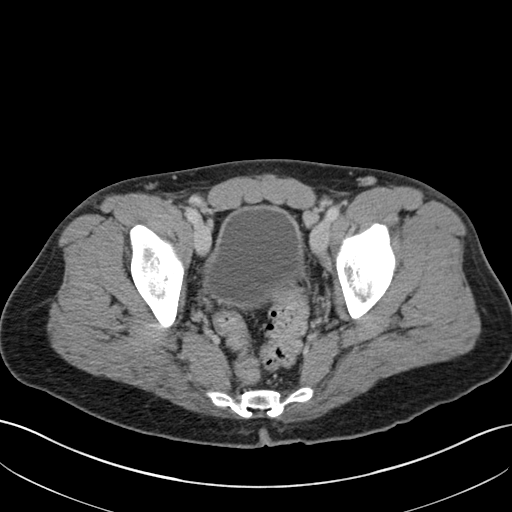
[im 28/100  soft-tissue]
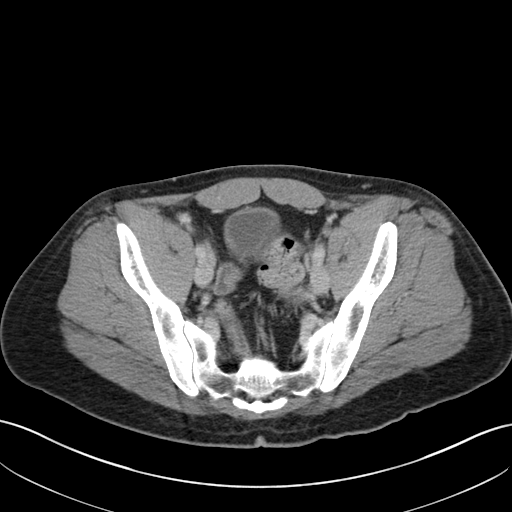
[im 34/100  soft-tissue]
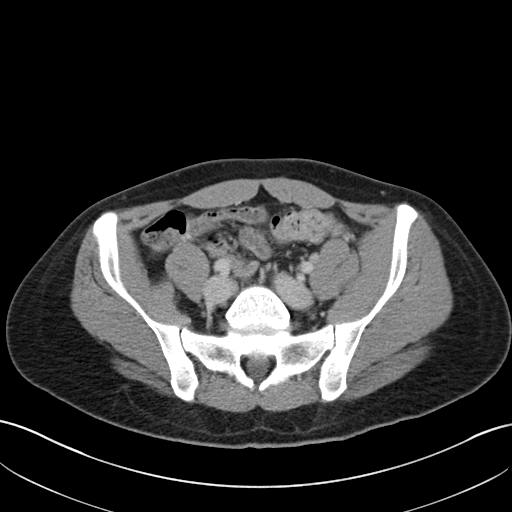
[im 45/100  soft-tissue]
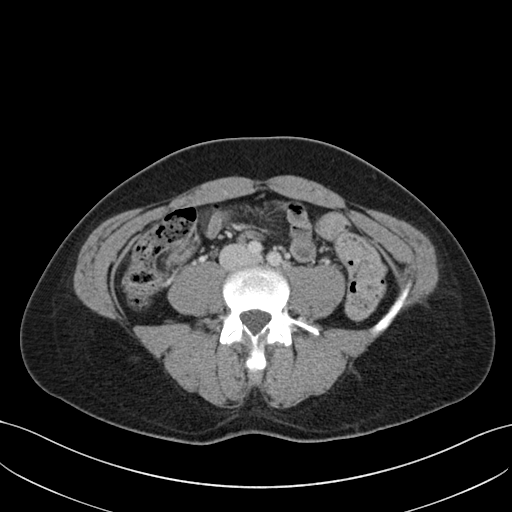
[im 50/100  soft-tissue]
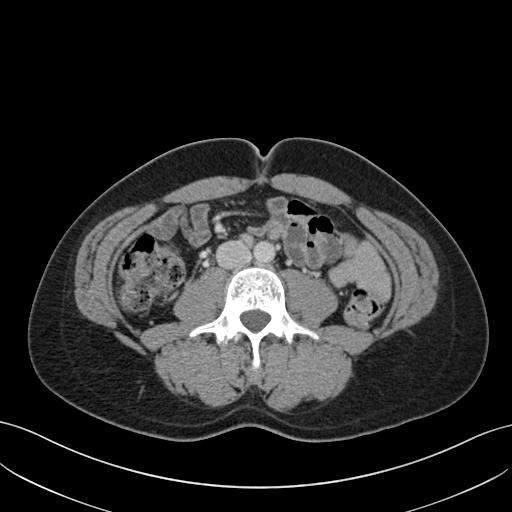
[im 56/100  soft-tissue]
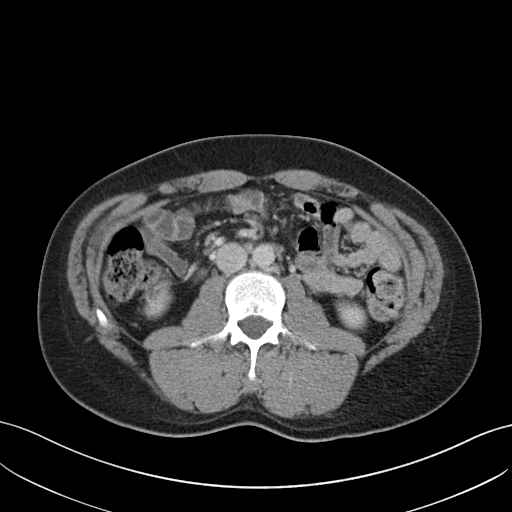
[im 67/100  soft-tissue]
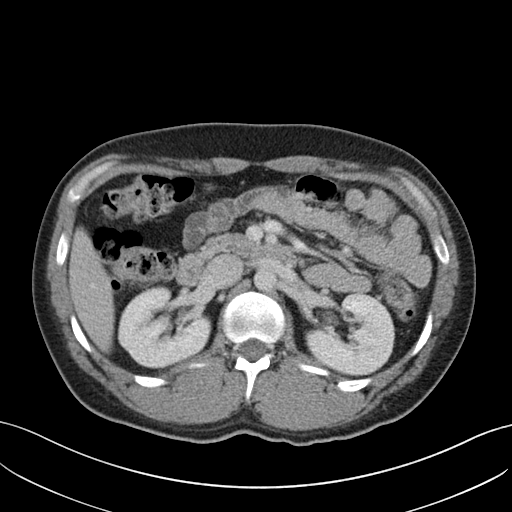
[im 67/100  bone]
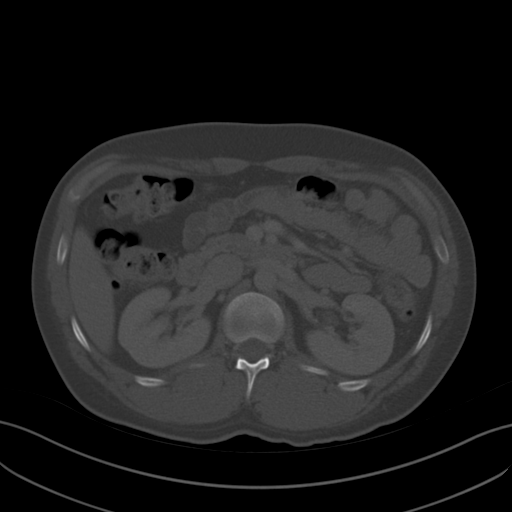
[im 72/100  soft-tissue]
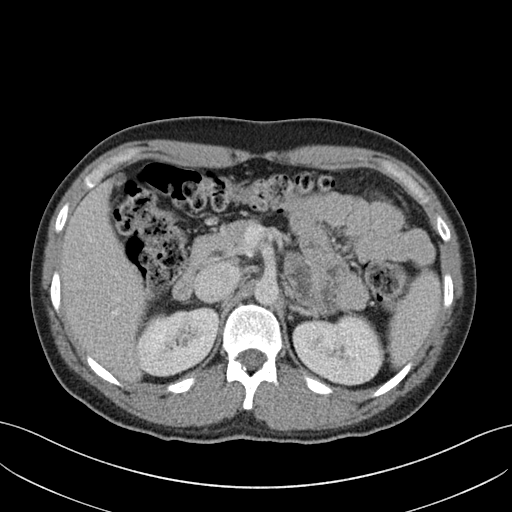
[im 78/100  soft-tissue]
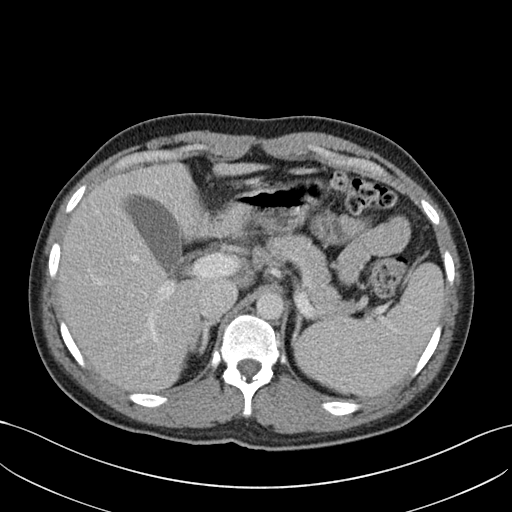
[im 89/100  soft-tissue]
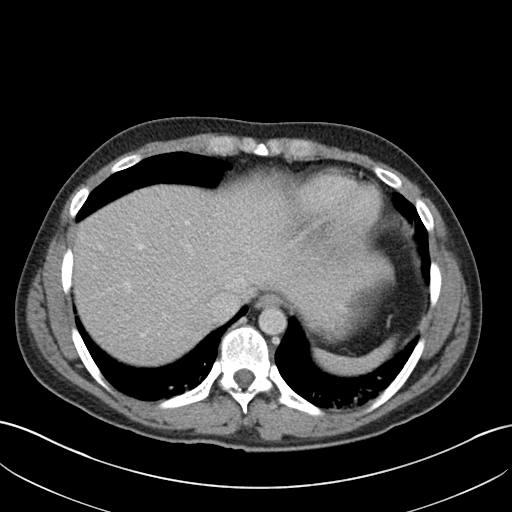
[im 94/100  soft-tissue]
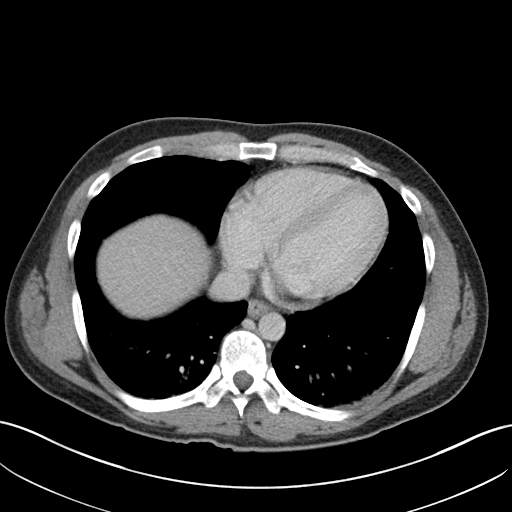

[Series 4: coronal st · coronal · 1.05mm/px · 3 of 122 slices shown]
[im 41/122  soft-tissue]
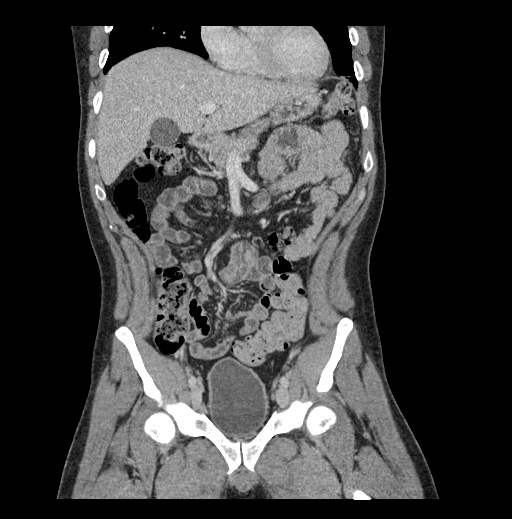
[im 54/122  soft-tissue]
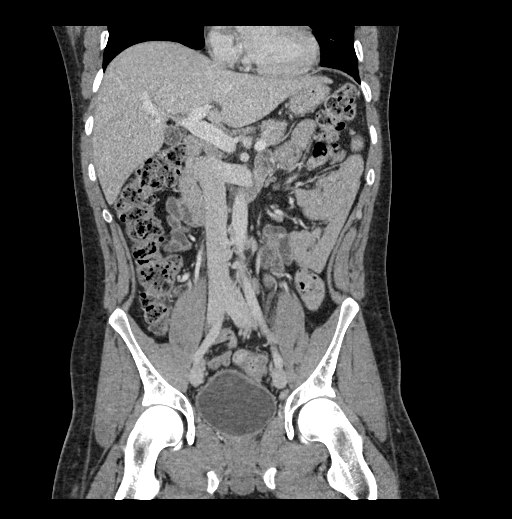
[im 68/122  soft-tissue]
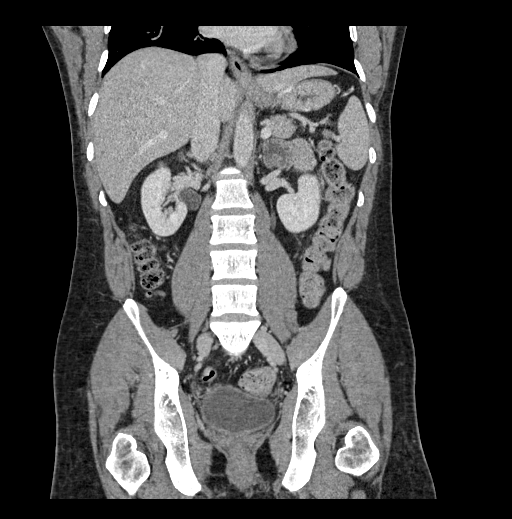

[16 of 46 positions shown; findings below may reference images not displayed]

FINDINGS: Lower chest: There is bibasilar atelectasis. There is no lung base
edema consolidation.

Hepatobiliary: There is mild fatty infiltration near the fissure for
the ligamentum teres. No focal liver lesions are evident. The
gallbladder wall is appreciably thickened. No biliary duct
dilatation.

Pancreas: There is no pancreatic mass or inflammatory focus.

Spleen: No splenic lesions are evident.

Adrenals/Urinary Tract: Adrenals appear unremarkable. Kidneys
bilaterally show no evident mass or hydronephrosis on either side.
There is an extrarenal pelvis on each side. There is no evident
renal or ureteral calculus on either side. Urinary bladder is
midline with wall thickness within normal limits.

Stomach/Bowel: There is no appreciable bowel wall or mesenteric
thickening. No evident bowel obstruction. Terminal ileum appears
unremarkable. No free air or portal venous air evident.

Vascular/Lymphatic: No abdominal aortic aneurysm. No vascular
lesions are appreciable period no adenopathy is evident in the
abdomen or pelvis by size criteria. Subcentimeter inguinal lymph
nodes are considered nonspecific.

Reproductive: Prostate and seminal vesicles are normal in size and
contour. There is thickening along the upper scrotum on the right,
consistent with the changes seen on scrotal ultrasound obtained
earlier in the day.

Other: The appendix appears normal. No abscess or ascites evident in
the abdomen or pelvis.

Musculoskeletal: There are pars defects at L5 bilaterally with
slight anterolisthesis of L5 on S1. There are no blastic or lytic
bone lesions. There is no intramuscular lesion evident.
IMPRESSION: 1. There is known inflammation within the right scrotal sac. The
superior most aspect of this inflammation is seen on this CT
examination. Note that most of the scrotum is not imaged on this CT
examination.

2. No bowel obstruction. No abscess in the abdomen or pelvis.
Appendix appears normal.

3. No evident renal or ureteral calculus. No hydronephrosis. Urinary
bladder wall thickness normal.

4. Pars defects at L5 bilaterally with slight spondylolisthesis at
L5-S1.
